# Patient Record
Sex: Male | Born: 1980 | Race: White | Hispanic: No | Marital: Single | Smoking: Never smoker
Health system: Southern US, Community
[De-identification: ages and names within clinical notes are randomized; demographics above are authoritative.]

## PROBLEM LIST (undated history)

## (undated) DIAGNOSIS — Z9289 Personal history of other medical treatment: Secondary | ICD-10-CM

## (undated) DIAGNOSIS — J45990 Exercise induced bronchospasm: Secondary | ICD-10-CM

## (undated) DIAGNOSIS — N200 Calculus of kidney: Secondary | ICD-10-CM

## (undated) DIAGNOSIS — F419 Anxiety disorder, unspecified: Secondary | ICD-10-CM

## (undated) DIAGNOSIS — R030 Elevated blood-pressure reading, without diagnosis of hypertension: Secondary | ICD-10-CM

## (undated) DIAGNOSIS — E162 Hypoglycemia, unspecified: Secondary | ICD-10-CM

## (undated) HISTORY — DX: Elevated blood-pressure reading, without diagnosis of hypertension: R03.0

## (undated) HISTORY — DX: Calculus of kidney: N20.0

## (undated) HISTORY — PX: HERNIA REPAIR: SHX51

## (undated) HISTORY — DX: Personal history of other medical treatment: Z92.89

---

## 2016-01-26 ENCOUNTER — Emergency Department (HOSPITAL_COMMUNITY)
Admission: EM | Admit: 2016-01-26 | Discharge: 2016-01-26 | Disposition: A | Discharge status: Home / Self Care | Payer: Self-pay | Attending: Emergency Medicine | Admitting: Emergency Medicine

## 2016-01-26 ENCOUNTER — Encounter (HOSPITAL_COMMUNITY): Payer: Self-pay

## 2016-01-26 DIAGNOSIS — Y9389 Activity, other specified: Secondary | ICD-10-CM | POA: Insufficient documentation

## 2016-01-26 DIAGNOSIS — Y990 Civilian activity done for income or pay: Secondary | ICD-10-CM | POA: Insufficient documentation

## 2016-01-26 DIAGNOSIS — T18120A Food in esophagus causing compression of trachea, initial encounter: Secondary | ICD-10-CM | POA: Insufficient documentation

## 2016-01-26 DIAGNOSIS — X58XXXA Exposure to other specified factors, initial encounter: Secondary | ICD-10-CM | POA: Insufficient documentation

## 2016-01-26 DIAGNOSIS — T18128A Food in esophagus causing other injury, initial encounter: Secondary | ICD-10-CM

## 2016-01-26 DIAGNOSIS — F17220 Nicotine dependence, chewing tobacco, uncomplicated: Secondary | ICD-10-CM | POA: Insufficient documentation

## 2016-01-26 DIAGNOSIS — Y929 Unspecified place or not applicable: Secondary | ICD-10-CM | POA: Insufficient documentation

## 2016-01-26 MED ORDER — OMEPRAZOLE 20 MG PO CPDR: 20.0000 mg | DELAYED_RELEASE_CAPSULE | Freq: Two times a day (BID) | ORAL | 1 refills | Status: DC

## 2016-01-26 MED ORDER — GLUCAGON HCL RDNA (DIAGNOSTIC) 1 MG IJ SOLR
1.0000 mg | Freq: Once | INTRAMUSCULAR | Status: AC
  Administered 2016-01-26: 1 mg via INTRAVENOUS
  Filled 2016-01-26: qty 1

## 2016-01-26 MED ORDER — ONDANSETRON HCL 4 MG/2ML IJ SOLN
4.0000 mg | Freq: Once | INTRAMUSCULAR | Status: AC
  Administered 2016-01-26: 4 mg via INTRAVENOUS
  Filled 2016-01-26: qty 2

## 2016-01-26 NOTE — ED Provider Notes (Addendum)
[facility]-EMERGENCY DEPT Provider Note   CSN: [id number] Arrival date & time: 01/26/16  2024     History   Chief Complaint Chief Complaint  Patient presents with  . Foreign Body    HPI Bruce Ryan is a 35 y.o. male. He presents with esophageal foreign body. Patient has history of a previous retained foreign body of some roast beef in February of this year. It seemed local emergency room. Was given glucagon. He states that about 3 hours later "about the same time that GI doctor got there" that passed. He did not have to undergo EGD or extraction. He did not see GI in follow-up. I.e. has never had endoscopy. He does use smokeless tobacco. He takes no medications. Reports no other significant medical history.  He was eating some steak tonight at work. He admits that he was hurting. He felt it lodge the base of his neck. He puts a finger towards the sternal notch. He is drooling and spitting saliva. He has been symptomatic for 2 hours upon his arrival.  He became symptomatic at approximately 6:45 PM tonight.  HPI  History reviewed. No pertinent past medical history.  There are no active problems to display for this patient.   History reviewed. No pertinent surgical history.     Home Medications    Prior to Admission medications   Medication Sig Start Date End Date Taking? Authorizing Provider  pseudoephedrine (SUDAFED) 60 MG tablet Take 60 mg by mouth every 4 (four) hours as needed for congestion.   Yes Historical Provider, MD  omeprazole (PRILOSEC) 20 MG capsule Take 1 capsule (20 mg total) by mouth 2 (two) times daily. 01/26/16   Rolland PorterMark Annia Gomm, MD    Family History History reviewed. No pertinent family history.  Social History Social History  Substance Use Topics  . Smoking status: Never Smoker  . Smokeless tobacco: Current User    Types: Chew  . Alcohol use Yes     Allergies   Coconut flavor and Penicillins   Review of Systems Review of Systems  Constitutional:  Negative for appetite change, chills, diaphoresis, fatigue and fever.  HENT: Negative for mouth sores, sore throat and trouble swallowing.        Throat pain. Unable to handle secretions. Spitting  Eyes: Negative for visual disturbance.  Respiratory: Negative for cough, chest tightness, shortness of breath and wheezing.   Cardiovascular: Negative for chest pain.  Gastrointestinal: Negative for abdominal distention, abdominal pain, diarrhea, nausea and vomiting.  Endocrine: Negative for polydipsia, polyphagia and polyuria.  Genitourinary: Negative for dysuria, frequency and hematuria.  Musculoskeletal: Negative for gait problem.  Skin: Negative for color change, pallor and rash.  Neurological: Negative for dizziness, syncope, light-headedness and headaches.  Hematological: Does not bruise/bleed easily.  Psychiatric/Behavioral: Negative for behavioral problems and confusion.     Physical Exam Updated Vital Signs BP (!) 187/104 (BP Location: Left Arm)   Pulse 106   Temp 99 F (37.2 C) (Oral)   Resp 18   Wt 267 lb 8 oz (121.3 kg)   SpO2 99%   Physical Exam  Constitutional: He is oriented to person, place, and time. He appears well-developed and well-nourished. No distress.  HENT:  Head: Normocephalic.  Holding emesis bag. Drooling and spitting. No respiratory distress.  Eyes: Conjunctivae are normal. Pupils are equal, round, and reactive to light. No scleral icterus.  Neck: Normal range of motion. Neck supple. No thyromegaly present.  Cardiovascular: Normal rate and regular rhythm.  Exam reveals no gallop and  no friction rub.   No murmur heard. Pulmonary/Chest: Effort normal and breath sounds normal. No respiratory distress. He has no wheezes. He has no rales.  Abdominal: Soft. Bowel sounds are normal. He exhibits no distension. There is no tenderness. There is no rebound.  Musculoskeletal: Normal range of motion.  Neurological: He is alert and oriented to person, place, and time.    Skin: Skin is warm and dry. No rash noted.  Psychiatric: He has a normal mood and affect. His behavior is normal.     ED Treatments / Results  Labs (all labs ordered are listed, but only abnormal results are displayed) Labs Reviewed - No data to display  EKG  EKG Interpretation None       Radiology No results found.  Procedures Procedures (including critical care time)  Medications Ordered in ED Medications  ondansetron (ZOFRAN) injection 4 mg (4 mg Intravenous Given 01/26/16 2139)  glucagon (human recombinant) (GLUCAGEN) injection 1 mg (1 mg Intravenous Given 01/26/16 2140)     Initial Impression / Assessment and Plan / ED Course  I have reviewed the triage vital signs and the nursing notes.  Pertinent labs & imaging results that were available during my care of the patient were reviewed by me and considered in my medical decision making (see chart for details).  Clinical Course    IV is placed. Given glucagon and Zofran. I placed a call to GI initially due to the lateness of the hour. At approximately 1 hour's time patient had resolution of symptoms. On reevaluation he has no other drooling. He is able to take sips of water followed by drinking entire glass of water without becoming symptomatic. He is appropriate for discharge home. I discussed with him the importance of small portions chewing his food well. Of also stressed the absolute importance of GI follow-up to evaluate for possible anatomical abnormality such as esophageal ring, stricture, remote possibility of cancer is dizzy smokeless tobacco. Encouraged to stop. He expressed understanding of these. We will discharge on proton pump inhibitor.  Final Clinical Impressions(s) / ED Diagnoses   Final diagnoses:  Food impaction of esophagus, initial encounter    New Prescriptions New Prescriptions   OMEPRAZOLE (PRILOSEC) 20 MG CAPSULE    Take 1 capsule (20 mg total) by mouth 2 (two) times daily.     Rolland PorterMark Carleigh Buccieri,  MD 01/26/16 [id number]    Rolland PorterMark Ayriel Texidor, MD 01/26/16 2223

## 2016-01-26 NOTE — Discharge Instructions (Signed)
Chew your food well. Small portions only.  Call our gastroenterology at the above number for follow-up appointment to discuss endoscopy.   You may have an anatomical abnormality of your esophagus such as a stricture that may need treatment to prevent further episodes.  Prilosec prescription as prescribed.

## 2016-01-26 NOTE — Progress Notes (Signed)
EDCM spoke to patient at bedside. Patient confirms he does not have a pcp or insurance living in [place].  EDCM provided patient with contact information to [facility], informed patient of services there.  EDCM also provided patient with list of pcps who accept self pay patients, list of discount pharmacies and websites needymeds.org and GoodRX.com for medication assistance, phone number to inquire about the orange card, phone number to inquire about Medicaid, phone number to inquire about the Affordable Care Act, financial resources in the community such as local churches, [employer], urban ministries, and dental assistance for uninsured patients.  Patient thankful for resources.  No further EDCM needs at this time. 

## 2016-01-26 NOTE — ED Triage Notes (Signed)
Pt has a piece of steak in his throat, he's able to swallow right now and is talking, pt has had this happen before.  Pt says this happened at 645pm

## 2016-01-26 NOTE — ED Notes (Signed)
Pt stated "I'm cured".

## 2016-02-24 ENCOUNTER — Encounter (HOSPITAL_COMMUNITY): Payer: Self-pay | Admitting: Emergency Medicine

## 2016-02-24 ENCOUNTER — Emergency Department (HOSPITAL_COMMUNITY)
Admission: EM | Admit: 2016-02-24 | Discharge: 2016-02-24 | Disposition: A | Discharge status: Home / Self Care | Payer: Self-pay | Attending: Emergency Medicine | Admitting: Emergency Medicine

## 2016-02-24 ENCOUNTER — Emergency Department (HOSPITAL_COMMUNITY): Payer: Self-pay

## 2016-02-24 DIAGNOSIS — F410 Panic disorder [episodic paroxysmal anxiety] without agoraphobia: Secondary | ICD-10-CM | POA: Insufficient documentation

## 2016-02-24 DIAGNOSIS — F1010 Alcohol abuse, uncomplicated: Secondary | ICD-10-CM | POA: Insufficient documentation

## 2016-02-24 LAB — I-STAT TROPONIN, ED: Troponin i, poc: 0 ng/mL (ref 0.00–0.08)

## 2016-02-24 LAB — CBC
HEMATOCRIT: 50.4 % (ref 39.0–52.0)
Hemoglobin: 17.3 g/dL — ABNORMAL HIGH (ref 13.0–17.0)
MCH: 31.1 pg (ref 26.0–34.0)
MCHC: 34.3 g/dL (ref 30.0–36.0)
MCV: 90.6 fL (ref 78.0–100.0)
PLATELETS: 286 10*3/uL (ref 150–400)
RBC: 5.56 MIL/uL (ref 4.22–5.81)
RDW: 13 % (ref 11.5–15.5)
WBC: 12.6 10*3/uL — AB (ref 4.0–10.5)

## 2016-02-24 LAB — BASIC METABOLIC PANEL
Anion gap: 6 (ref 5–15)
BUN: 10 mg/dL (ref 6–20)
CHLORIDE: 105 mmol/L (ref 101–111)
CO2: 27 mmol/L (ref 22–32)
Calcium: 9.4 mg/dL (ref 8.9–10.3)
Creatinine, Ser: 0.92 mg/dL (ref 0.61–1.24)
GFR calc non Af Amer: >60 mL/min (ref >60)
Glucose, Bld: 99 mg/dL (ref 65–99)
POTASSIUM: 4.3 mmol/L (ref 3.5–5.1)
SODIUM: 138 mmol/L (ref 135–145)

## 2016-02-24 MED ORDER — LORAZEPAM 1 MG PO TABS
1.0000 mg | ORAL_TABLET | Freq: Three times a day (TID) | ORAL | 0 refills | Status: DC | PRN
Start: 2016-02-24 — End: 2016-07-25

## 2016-02-24 MED ORDER — LORAZEPAM 1 MG PO TABS
1.0000 mg | ORAL_TABLET | Freq: Once | ORAL | Status: AC
  Administered 2016-02-24: 1 mg via ORAL
  Filled 2016-02-24: qty 1

## 2016-02-24 MED ORDER — LORAZEPAM 1 MG PO TABS: 1.0000 mg | ORAL_TABLET | Freq: Three times a day (TID) | ORAL | 0 refills | Status: DC | PRN

## 2016-02-24 NOTE — Discharge Instructions (Signed)
Read the information below.  You may return to the Emergency Department at any time for worsening condition or any new symptoms that concern you.   If you develop worsening chest pain, shortness of breath, fever, you pass out, or become weak or dizzy, return to the ER for a recheck.    °

## 2016-02-24 NOTE — ED Triage Notes (Signed)
Pt c/o intermittent chest tightness, palpitations, sensation of tachycardia, tingling across body, diaphoresis, anxiety onset Saturday. Worse when thinking about symptoms. Better when walking. Pt started drinking heavily, one 12 pack of beer every day, every day in July after losing job, stopped drinking on Saturday. Still unemployed which causes emotional distress. Had alcohol on Sunday which made symptoms worse.

## 2016-02-24 NOTE — ED Notes (Signed)
Provider in room  

## 2016-02-24 NOTE — ED Provider Notes (Signed)
[facility]-EMERGENCY DEPT Provider Note   CSN: [id number] Arrival date & time: 02/24/16  1636     History   Chief Complaint Chief Complaint  Patient presents with  . Chest Pain    HPI Bruce Ryan is a 35 y.o. male.  HPI  Patient presents with intermittent episodes of heart racing, chest tightness, SOB, tingling in the extremities, sweating that last approximately 15-20 minutes.  They began 5 days ago and occur randomly.  They do not occur if patient is busy or distracted.  The symptoms improve with talking to loved ones or walking around.  Pt has been drinking approximately 12 cans of beer daily since the summer.  This weekend he stopped drinking, but started having symptoms again after drinking 3-4 beers.  He has not had any ETOH in 4 days.   Denies recent immobilization, leg swelling, personal or family hx blood clots.  Denies medical problems with exception of asthma.  Denies early family hx CAD - his father died of MI in his late 3560s, was his first known MI.     History reviewed. No pertinent past medical history.  There are no active problems to display for this patient.   History reviewed. No pertinent surgical history.     Home Medications    Prior to Admission medications   Medication Sig Start Date End Date Taking? Authorizing Provider  ibuprofen (ADVIL,MOTRIN) 200 MG tablet Take 200-400 mg by mouth every 6 (six) hours as needed for fever, headache, mild pain, moderate pain or cramping.   Yes Historical Provider, MD  LORazepam (ATIVAN) 1 MG tablet Take 1 tablet (1 mg total) by mouth 3 (three) times daily as needed for anxiety (or withdrawal symptoms). 02/24/16   Trixie DredgeEmily Harvard Zeiss, PA-C  omeprazole (PRILOSEC) 20 MG capsule Take 1 capsule (20 mg total) by mouth 2 (two) times daily. Patient not taking: Reported on 02/24/2016 01/26/16   Rolland PorterMark James, MD    Family History History reviewed. No pertinent family history.  Social History Social History  Substance Use Topics  .  Smoking status: Never Smoker  . Smokeless tobacco: Current User    Types: Chew  . Alcohol use Yes     Allergies   Coconut flavor and Penicillins   Review of Systems Review of Systems  All other systems reviewed and are negative.    Physical Exam Updated Vital Signs BP 141/92 (BP Location: Right Arm)   Pulse 65   Temp 97.9 F (36.6 C) (Oral)   Resp 18   Ht 5\' 11"  (1.803 m)   Wt 117.9 kg   SpO2 98%   BMI 36.26 kg/m   Physical Exam  Constitutional: He appears well-developed and well-nourished. No distress.  HENT:  Head: Normocephalic and atraumatic.  Neck: Neck supple.  Cardiovascular: Normal rate and regular rhythm.   Pulmonary/Chest: Effort normal and breath sounds normal. No respiratory distress. He has no wheezes. He has no rales.  Abdominal: Soft. He exhibits no distension and no mass. There is no tenderness. There is no rebound and no guarding.  Musculoskeletal: He exhibits no edema.  Neurological: He is alert. He exhibits normal muscle tone.  Skin: He is not diaphoretic.  Psychiatric: His speech is normal and behavior is normal. Thought content normal. His mood appears anxious.  Nursing note and vitals reviewed.    ED Treatments / Results  Labs (all labs ordered are listed, but only abnormal results are displayed) Labs Reviewed  CBC - Abnormal; Notable for the following:  Result Value   WBC 12.6 (*)    Hemoglobin 17.3 (*)    All other components within normal limits  BASIC METABOLIC PANEL  I-STAT TROPOININ, ED    EKG  EKG Interpretation None       Radiology Dg Chest 2 View  Result Date: 02/24/2016 CLINICAL DATA:  Intermittent chest tightness, palpitations and tachycardia. EXAM: CHEST  2 VIEW COMPARISON:  None. FINDINGS: The heart size and mediastinal contours are within normal limits. Both lungs are clear. The visualized skeletal structures are unremarkable. IMPRESSION: No active cardiopulmonary disease. Electronically Signed   By: Tollie Ethavid   Kwon M.D.   On: 02/24/2016 19:06    Procedures Procedures (including critical care time)  Medications Ordered in ED Medications  LORazepam (ATIVAN) tablet 1 mg (1 mg Oral Given 02/24/16 2022)     Initial Impression / Assessment and Plan / ED Course  I have reviewed the triage vital signs and the nursing notes.  Pertinent labs & imaging results that were available during my care of the patient were reviewed by me and considered in my medical decision making (see chart for details).  Clinical Course    Afebrile, nontoxic patient with constellation of symptoms suspicious for panic attacks vs possible alcohol withdrawal.  Chest pain is atypical - comes and goes, and does not happen with distraction, better with talking to people and calming down.  Doubt ACS, PE, pneumonia, dissection.  He is hypertensive in ED but also appears very anxious, improved after ativan.  D/C home with PCP and substance abuse resources for close follow up, ativan for symptom management.  Discussed result, findings, treatment, and follow up  with patient.  Pt given return precautions.  Pt verbalizes understanding and agrees with plan.       Final Clinical Impressions(s) / ED Diagnoses   Final diagnoses:  Panic attack  Alcohol abuse    New Prescriptions Current Discharge Medication List    START taking these medications   Details  LORazepam (ATIVAN) 1 MG tablet Take 1 tablet (1 mg total) by mouth 3 (three) times daily as needed for anxiety (or withdrawal symptoms). Qty: 15 tablet, Refills: 0         Trixie Dredgemily Billey Wojciak, PA-C 02/24/16 2141    Arby BarretteMarcy Pfeiffer, MD 02/25/16 [phone]

## 2016-03-17 ENCOUNTER — Encounter (HOSPITAL_COMMUNITY): Payer: Self-pay

## 2016-03-17 ENCOUNTER — Emergency Department (HOSPITAL_COMMUNITY)
Admission: EM | Admit: 2016-03-17 | Discharge: 2016-03-17 | Disposition: A | Discharge status: Home / Self Care | Payer: Self-pay | Attending: Emergency Medicine | Admitting: Emergency Medicine

## 2016-03-17 ENCOUNTER — Emergency Department (HOSPITAL_COMMUNITY): Payer: Self-pay

## 2016-03-17 DIAGNOSIS — J45909 Unspecified asthma, uncomplicated: Secondary | ICD-10-CM | POA: Insufficient documentation

## 2016-03-17 DIAGNOSIS — Z79899 Other long term (current) drug therapy: Secondary | ICD-10-CM | POA: Insufficient documentation

## 2016-03-17 DIAGNOSIS — R079 Chest pain, unspecified: Secondary | ICD-10-CM

## 2016-03-17 DIAGNOSIS — R0789 Other chest pain: Secondary | ICD-10-CM | POA: Insufficient documentation

## 2016-03-17 HISTORY — DX: Anxiety disorder, unspecified: F41.9

## 2016-03-17 HISTORY — DX: Hypoglycemia, unspecified: E16.2

## 2016-03-17 HISTORY — DX: Exercise induced bronchospasm: J45.990

## 2016-03-17 LAB — CBC
HCT: 48 % (ref 39.0–52.0)
HEMOGLOBIN: 16.3 g/dL (ref 13.0–17.0)
MCH: 30.8 pg (ref 26.0–34.0)
MCHC: 34 g/dL (ref 30.0–36.0)
MCV: 90.7 fL (ref 78.0–100.0)
PLATELETS: 297 10*3/uL (ref 150–400)
RBC: 5.29 MIL/uL (ref 4.22–5.81)
RDW: 12.8 % (ref 11.5–15.5)
WBC: 11.3 10*3/uL — ABNORMAL HIGH (ref 4.0–10.5)

## 2016-03-17 LAB — BASIC METABOLIC PANEL
ANION GAP: 9 (ref 5–15)
BUN: 9 mg/dL (ref 6–20)
CALCIUM: 9.8 mg/dL (ref 8.9–10.3)
CO2: 23 mmol/L (ref 22–32)
CREATININE: 0.94 mg/dL (ref 0.61–1.24)
Chloride: 107 mmol/L (ref 101–111)
GFR calc Af Amer: >60 mL/min (ref >60)
GLUCOSE: 98 mg/dL (ref 65–99)
Potassium: 3.7 mmol/L (ref 3.5–5.1)
Sodium: 139 mmol/L (ref 135–145)

## 2016-03-17 LAB — I-STAT TROPONIN, ED
TROPONIN I, POC: 0.01 ng/mL (ref 0.00–0.08)
Troponin i, poc: 0 ng/mL (ref 0.00–0.08)

## 2016-03-17 NOTE — ED Triage Notes (Signed)
Per Pt, Pt is coming from home with complaints of SOB and Chest pain that started today. Pt was walking when the episode came along. Pt has Hx of anxiety and recent stress. For about five or six years, pt self medicated with alcohol. Pt has been sober for one month. Pt's anxiety has increased significantly. [facility] same symptoms a couple weeks ago and was diagnosed with withdrawal and anxiety. Pt's chest pain subsided while EMS and was going to come himslef, pt left to drive here and pain returned. EMS brought patient in due to return of CP. Pt arrives to the ED with no Chest pain. Pt took 324 mg of ASA at the house before EMS arrival. 138/92, 89 CBG, 97% on RA, 66 HR.

## 2016-03-17 NOTE — ED Notes (Signed)
ED Provider at bedside. 

## 2016-03-17 NOTE — ED Provider Notes (Signed)
[facility]-EMERGENCY DEPT Provider Note   CSN: [id number] Arrival date & time: 03/17/16  1740     History   Chief Complaint Chief Complaint  Patient presents with  . Chest Pain    HPI Bruce Ryan is a [age] y.o. male.  Patient without other medical problems presents for evaluation of chest pain. He describes the discomfort as pressure, intermittent, associated with SOB. Currently pain free. He reports that over the last several months he has these episodes, usually brought on by activity or eating, and lately have included "heart fluttering" and high than usual heart rate. He was seen one month ago and was told his symptoms were related to alcohol use and anxiety. Tonight he states that his symptoms included radiation into the shoulder and arm. He states that in the last month he has stopped drinking, stopped use of caffeine, stopped using e-cigarettes and changed his diet to more healthy foods and less salt, but reports his symptoms have been the same.    The history is provided by the patient. No language interpreter was used.  Chest Pain   Associated symptoms include diaphoresis, palpitations and shortness of breath. Pertinent negatives include no abdominal pain, no cough, no fever, no nausea and no weakness.    Past Medical History:  Diagnosis Date  . Anxiety   . Exercise-induced asthma   . Hypoglycemia     There are no active problems to display for this patient.   Past Surgical History:  Procedure Laterality Date  . HERNIA REPAIR         Home Medications    Prior to Admission medications   Medication Sig Start Date End Date Taking? Authorizing Provider  ibuprofen (ADVIL,MOTRIN) 200 MG tablet Take 200-400 mg by mouth every 6 (six) hours as needed for fever, headache, mild pain, moderate pain or cramping.    Historical Provider, MD  LORazepam (ATIVAN) 1 MG tablet Take 1 tablet (1 mg total) by mouth 3 (three) times daily as needed for anxiety (or withdrawal symptoms).  02/24/16   Trixie DredgeEmily West, PA-C  omeprazole (PRILOSEC) 20 MG capsule Take 1 capsule (20 mg total) by mouth 2 (two) times daily. Patient not taking: Reported on 02/24/2016 01/26/16   Rolland PorterMark James, MD    Family History No family history on file.  Social History Social History  Substance Use Topics  . Smoking status: Never Smoker  . Smokeless tobacco: Current User    Types: Chew  . Alcohol use No     Comment: Sober for one month      Allergies   Coconut flavor and Penicillins   Review of Systems Review of Systems  Constitutional: Positive for diaphoresis. Negative for chills and fever.  Respiratory: Positive for shortness of breath. Negative for cough.   Cardiovascular: Positive for chest pain and palpitations.  Gastrointestinal: Negative.  Negative for abdominal pain and nausea.  Musculoskeletal: Negative.   Neurological: Negative.  Negative for syncope and weakness.     Physical Exam Updated Vital Signs BP 131/94 (BP Location: Right Arm)   Pulse 66   Temp 98.1 F (36.7 C) (Oral)   Resp 14   Ht 6' (1.829 m)   Wt 117.9 kg   SpO2 97%   BMI 35.26 kg/m   Physical Exam  Constitutional: He is oriented to person, place, and time. He appears well-developed and well-nourished.  HENT:  Head: Normocephalic.  Neck: Normal range of motion. Neck supple.  Cardiovascular: Normal rate and regular rhythm.   No murmur heard.  No carotid bruit.  Pulmonary/Chest: Effort normal and breath sounds normal. He has no wheezes. He has no rales.  Abdominal: Soft. Bowel sounds are normal. There is no tenderness. There is no rebound and no guarding.  Musculoskeletal: Normal range of motion. He exhibits no edema.  Neurological: He is alert and oriented to person, place, and time.  Skin: Skin is warm and dry. No rash noted.  Psychiatric: He has a normal mood and affect.     ED Treatments / Results  Labs (all labs ordered are listed, but only abnormal results are displayed) Labs Reviewed  CBC  - Abnormal; Notable for the following:       Result Value   WBC 11.3 (*)    All other components within normal limits  BASIC METABOLIC PANEL  I-STAT TROPOININ, ED  Rosezena SensorI-STAT TROPOININ, ED    EKG  EKG Interpretation None       Radiology Dg Chest 2 View  Result Date: 03/17/2016 CLINICAL DATA:  Shortness of breath, chest pain EXAM: CHEST  2 VIEW COMPARISON:  02/24/2016 FINDINGS: Lungs are clear.  No pleural effusion or pneumothorax. The heart is normal in size. Visualized osseous structures are within normal limits. IMPRESSION: Normal chest radiographs. Electronically Signed   By: Charline BillsSriyesh  Krishnan M.D.   On: 03/17/2016 18:56    Procedures Procedures (including critical care time)  Medications Ordered in ED Medications - No data to display   Initial Impression / Assessment and Plan / ED Course  I have reviewed the triage vital signs and the nursing notes.  Pertinent labs & imaging results that were available during my care of the patient were reviewed by me and considered in my medical decision making (see chart for details).  Clinical Course     Patient with history of chest pain and SOB presents with same. Currently symptom free. Heart score of 1.  Evaluation today, including EKG, CXR, troponin and delta troponin are negative. He reports these symptoms as chronic. Suspect component of anxiety.  He can be discharged home and is encouraged to establish with a primary care provider for further outpatient evaluation. Referrals provided. Encouraged him to continue alcohol abstinance, his improved diet and not smoking.   Final Clinical Impressions(s) / ED Diagnoses   Final diagnoses:  None  1. Chest pain   New Prescriptions New Prescriptions   No medications on file     Danne HarborShari Kee Drudge, PA-C 03/17/16 2307    Lavera Guiseana Duo Liu, MD 03/18/16 1055

## 2016-03-17 NOTE — Discharge Instructions (Signed)
Your lab, x-ray, EKG studies here are all negative. You can be discharged home and are encouraged to get established with a primary care provider for further outpatient evaluation if symptoms continues. Return to the Emergency Department with any new or concerning symptoms at any time.

## 2016-07-14 ENCOUNTER — Emergency Department (HOSPITAL_COMMUNITY)
Admission: EM | Admit: 2016-07-14 | Discharge: 2016-07-14 | Disposition: A | Discharge status: Home / Self Care | Payer: BLUE CROSS/BLUE SHIELD | Attending: Emergency Medicine | Admitting: Emergency Medicine

## 2016-07-14 ENCOUNTER — Emergency Department (HOSPITAL_COMMUNITY): Payer: BLUE CROSS/BLUE SHIELD

## 2016-07-14 ENCOUNTER — Encounter (HOSPITAL_COMMUNITY): Payer: Self-pay | Admitting: *Deleted

## 2016-07-14 DIAGNOSIS — J45909 Unspecified asthma, uncomplicated: Secondary | ICD-10-CM | POA: Insufficient documentation

## 2016-07-14 DIAGNOSIS — R002 Palpitations: Secondary | ICD-10-CM | POA: Diagnosis not present

## 2016-07-14 DIAGNOSIS — Z7982 Long term (current) use of aspirin: Secondary | ICD-10-CM | POA: Diagnosis not present

## 2016-07-14 DIAGNOSIS — R079 Chest pain, unspecified: Secondary | ICD-10-CM

## 2016-07-14 LAB — CBC
HCT: 48 % (ref 39.0–52.0)
Hemoglobin: 16.3 g/dL (ref 13.0–17.0)
MCH: 30 pg (ref 26.0–34.0)
MCHC: 34 g/dL (ref 30.0–36.0)
MCV: 88.2 fL (ref 78.0–100.0)
Platelets: 264 10*3/uL (ref 150–400)
RBC: 5.44 MIL/uL (ref 4.22–5.81)
RDW: 13.7 % (ref 11.5–15.5)
WBC: 13.1 10*3/uL — AB (ref 4.0–10.5)

## 2016-07-14 LAB — BASIC METABOLIC PANEL
ANION GAP: 9 (ref 5–15)
BUN: 9 mg/dL (ref 6–20)
CALCIUM: 9.5 mg/dL (ref 8.9–10.3)
CO2: 25 mmol/L (ref 22–32)
Chloride: 103 mmol/L (ref 101–111)
Creatinine, Ser: 0.76 mg/dL (ref 0.61–1.24)
GFR calc Af Amer: >60 mL/min (ref >60)
GLUCOSE: 102 mg/dL — AB (ref 65–99)
Potassium: 3.7 mmol/L (ref 3.5–5.1)
SODIUM: 137 mmol/L (ref 135–145)

## 2016-07-14 LAB — I-STAT TROPONIN, ED
TROPONIN I, POC: 0 ng/mL (ref 0.00–0.08)
Troponin i, poc: 0 ng/mL (ref 0.00–0.08)

## 2016-07-14 NOTE — Discharge Instructions (Signed)
Avoid caffeine and tobacco.   SChedule appointment with cardiology next week.  If you were given medicines take as directed.  If you are on coumadin or contraceptives realize their levels and effectiveness is altered by many different medicines.  If you have any reaction (rash, tongues swelling, other) to the medicines stop taking and see a physician.    If your blood pressure was elevated in the ER make sure you follow up for management with a primary doctor or return for chest pain, shortness of breath or stroke symptoms.  Please follow up as directed and return to the ER or see a physician for new or worsening symptoms.  Thank you. Vitals:   07/14/16 1332 07/14/16 1609  BP: (!) 169/107 133/89  Pulse: 75 67  Resp: 19 18  Temp: 97.9 F (36.6 C) 98 F (36.7 C)  TempSrc: Oral Oral  SpO2: 100% 98%

## 2016-07-14 NOTE — ED Provider Notes (Signed)
[facility]EMER[facility]ENCY DEPT Provider Note   CSN: [id number] Arrival date & time: 07/14/16  1321     History   Chief Complaint Chief Complaint  Patient presents with  . Chest Pain    HPI Bruce Ryan is a 36 y.o. male.  Patient presents with intermittent chest pain and palpitations. Patient had an episode yesterday last almost her today and resolved and then had another episode this morning that lasted 1-2 hours. Patient felt that his heart rate was around 1:30 throughout the day yesterday and then today was around 50. Patient's family history of both coronary artery disease and arrhythmia in his mother. Patient has improved his diet significantly and lost 35 pounds. Recent thyroid test was normal per his report. Patient denies any blood clot risk factors, no known cardiac issues. No exertional syncope. Patient has gotten lightheaded recently at the palpitations.      Past Medical History:  Diagnosis Date  . Anxiety   . Exercise-induced asthma   . Hypoglycemia     There are no active problems to display for this patient.   Past Surgical History:  Procedure Laterality Date  . HERNIA REPAIR         Home Medications    Prior to Admission medications   Medication Sig Start Date End Date Taking? Authorizing Provider  albuterol (PROVENTIL HFA;VENTOLIN HFA) 108 (90 Base) MCG/ACT inhaler Inhale 1 puff into the lungs every 6 (six) hours as needed for wheezing or shortness of breath.    Historical Provider, MD  aspirin 81 MG chewable tablet Chew 81 mg by mouth daily as needed for mild pain.    Historical Provider, MD  ibuprofen (ADVIL,MOTRIN) 200 MG tablet Take 200-400 mg by mouth every 6 (six) hours as needed for fever, headache, mild pain, moderate pain or cramping.    Historical Provider, MD  LORazepam (ATIVAN) 1 MG tablet Take 1 tablet (1 mg total) by mouth 3 (three) times daily as needed for anxiety (or withdrawal symptoms). 02/24/16   Trixie Dredge, PA-C  omeprazole (PRILOSEC) 20  MG capsule Take 1 capsule (20 mg total) by mouth 2 (two) times daily. Patient not taking: Reported on 03/17/2016 01/26/16   Rolland Porter, MD    Family History No family history on file.  Social History Social History  Substance Use Topics  . Smoking status: Never Smoker  . Smokeless tobacco: Current User    Types: Chew  . Alcohol use No     Comment: Sober for one month      Allergies   Coconut flavor and Penicillins   Review of Systems Review of Systems  Constitutional: Negative for chills and fever.  HENT: Negative for congestion.   Eyes: Negative for visual disturbance.  Respiratory: Negative for shortness of breath.   Cardiovascular: Positive for chest pain and palpitations. Negative for leg swelling.  Gastrointestinal: Negative for abdominal pain and vomiting.  Genitourinary: Negative for dysuria and flank pain.  Musculoskeletal: Negative for back pain, neck pain and neck stiffness.  Skin: Negative for rash.  Neurological: Positive for light-headedness. Negative for headaches.     Physical Exam Updated Vital Signs BP 133/89 (BP Location: Left Arm)   Pulse 67   Temp 98 F (36.7 C) (Oral)   Resp 18   SpO2 98%   Physical Exam  Constitutional: He is oriented to person, place, and time. He appears well-developed and well-nourished.  HENT:  Head: Normocephalic and atraumatic.  Eyes: Conjunctivae are normal. Right eye exhibits no discharge. Left eye exhibits  no discharge.  Neck: Normal range of motion. Neck supple. No tracheal deviation present.  Cardiovascular: Normal rate, regular rhythm and intact distal pulses.   Pulmonary/Chest: Effort normal and breath sounds normal.  Abdominal: Soft. He exhibits no distension. There is no tenderness. There is no guarding.  Musculoskeletal: He exhibits no edema or tenderness.  Neurological: He is alert and oriented to person, place, and time.  Skin: Skin is warm. No rash noted.  Psychiatric: He has a normal mood and affect.    Nursing note and vitals reviewed.    ED Treatments / Results  Labs (all labs ordered are listed, but only abnormal results are displayed) Labs Reviewed  BASIC METABOLIC PANEL - Abnormal; Notable for the following:       Result Value   Glucose, Bld 102 (*)    All other components within normal limits  CBC - Abnormal; Notable for the following:    WBC 13.1 (*)    All other components within normal limits  I-STAT TROPOININ, ED  I-STAT TROPOININ, ED    EKG  EKG Interpretation  Date/Time:  Friday July 14 2016 13:29:48 EDT Ventricular Rate:  65 PR Interval:  158 QRS Duration: 68 QT Interval:  374 QTC Calculation: 388 R Axis:   28 Text Interpretation:  Normal sinus rhythm Low voltage QRS Borderline ECG Confirmed by Shanan Mcmiller MD, Merin Borjon [phone]) on 07/14/2016 3:43:48 PM       Radiology Dg Chest 2 View  Result Date: 07/14/2016 CLINICAL DATA:  Chest pain EXAM: CHEST  2 VIEW COMPARISON:  03/17/2016 FINDINGS: Normal heart size and mediastinal contours. No acute infiltrate or edema. No effusion or pneumothorax. No acute osseous findings. IMPRESSION: Negative chest. Electronically Signed   By: Marnee Spring M.D.   On: 07/14/2016 13:54    Procedures Procedures (including critical care time)  Medications Ordered in ED Medications - No data to display   Initial Impression / Assessment and Plan / ED Course  I have reviewed the triage vital signs and the nursing notes.  Pertinent labs & imaging results that were available during my care of the patient were reviewed by me and considered in my medical decision making (see chart for details).    Patient presents with intermittent chest pain and palpitations. Patient overall low risk cardiac. Low risk blood clot PE RC negative. Patient chest pain-free on exam. Discussed importance of follow-up for further evaluation from cardiology to discuss monitor and further workup. Patient comfortable this plan. Delta troponin negative. EKG  unremarkable.  Results and differential diagnosis were discussed with the patient/parent/guardian. Xrays were independently reviewed by myself.  Close follow up outpatient was discussed, comfortable with the plan.   Medications - No data to display  Vitals:   07/14/16 1332 07/14/16 1609  BP: (!) 169/107 133/89  Pulse: 75 67  Resp: 19 18  Temp: 97.9 F (36.6 C) 98 F (36.7 C)  TempSrc: Oral Oral  SpO2: 100% 98%    Final diagnoses:  Heart palpitations  Acute chest pain     Final Clinical Impressions(s) / ED Diagnoses   Final diagnoses:  Heart palpitations  Acute chest pain    New Prescriptions New Prescriptions   No medications on file     Blane Ohara, MD 07/14/16 1756

## 2016-07-14 NOTE — ED Triage Notes (Signed)
pt states that he began having chest pain about 1 hour prior. Pt states that he was laying down when it occured. Pt states that he noticed his heart was racing yesterday. Pt states that today it was reading low in the 50's

## 2016-07-21 ENCOUNTER — Encounter: Payer: Self-pay | Admitting: Physician Assistant

## 2016-07-25 ENCOUNTER — Encounter: Payer: Self-pay | Admitting: Physician Assistant

## 2016-07-25 ENCOUNTER — Ambulatory Visit (INDEPENDENT_AMBULATORY_CARE_PROVIDER_SITE_OTHER): Payer: BLUE CROSS/BLUE SHIELD | Admitting: Physician Assistant

## 2016-07-25 VITALS — BP 130/60 | HR 71 | Ht 71.0 in | Wt 235.1 lb

## 2016-07-25 DIAGNOSIS — R002 Palpitations: Secondary | ICD-10-CM | POA: Diagnosis not present

## 2016-07-25 DIAGNOSIS — R0789 Other chest pain: Secondary | ICD-10-CM | POA: Diagnosis not present

## 2016-07-25 NOTE — Patient Instructions (Addendum)
Medication Instructions:  Your physician recommends that you continue on your current medications as directed. Please refer to the Current Medication list given to you today..  Labwork: NONE ORDERED   Testing/Procedures: 1. Your physician has requested that you have a stress echocardiogram. For further information please visit [email or web address] Please follow instruction sheet as given.  2. Your physician has recommended that you wear an event monitor. Event monitors are medical devices that record the heart's electrical activity. Doctors most often [place] these monitors to diagnose arrhythmias. Arrhythmias are problems with the speed or rhythm of the heartbeat. The monitor is a small, portable device. You can wear one while you do your normal daily activities. This is usually used to diagnose what is causing palpitations/syncope (passing out).  Follow-Up: DR. Elease Hashimoto OR SCOTT WEAVER, PAC AS NEEDED PENDING TEST RESULTS  Any Other Special Instructions Will Be Listed Below (If Applicable). YOU HAVE BEEN GIVEN A LIST OF PRIMARY CARE PHYSICIANS TO CALL AND MAKE AN APPT  If you need a refill on your cardiac medications before your next appointment, please call your pharmacy.

## 2016-07-25 NOTE — Progress Notes (Signed)
Cardiology Office Note:    Date:  07/25/2016   ID:  Bruce Ryan, DOB 1980-04-02, MRN [id number]  PCP:  No PCP Per Patient  Cardiologist:  New - Dr. Delane Ginger / Tereso Newcomer, PA-C   Electrophysiologist:  n/a  Referring MD: No ref. provider found   Chief Complaint  Patient presents with  . Palpitations  . Chest Pain    History of Present Illness:    Bruce Ryan is a 36 y.o. male with a hx of exercised induced asthma, alcohol abuse, anxiety.    He was evaluated in the emergency room 07/14/16 for chest pain and palpitations. Troponin levels were negative. ECG demonstrated normal sinus rhythm. Chest x-ray was unremarkable.  He was asked to follow up with Cardiology for evaluation.   He is here alone. He previously chewed tobacco and drank heavily. He would drink 6-12 beers per day. He stopped drinking alcohol around November 2017. He notes palpitations since September 2017. He has started to diet and exercise. He's lost about 50 pounds. His palpitations are described as a hard heartbeat and skipping sensation. It may last seconds to minutes. He can notice it at rest or with exertion. He can also exert himself without symptoms. Since September, the frequency of his palpitations has decreased. He does get dizzy at times with palpitations. He denies frank syncope. He does note an associated headache and blurry vision at times. He walks daily for exercise. He may walk about an hour at a time. He sometimes has palpitations. Resting makes his symptoms better. He describes chest discomfort with his palpitations. He denies chest discomfort without palpitations. He denies dyspnea, orthopnea, PND or edema. He has been told he snores in the past. There has been no witnessed apnea. His girlfriend has told him that his snoring has improved dramatically since losing weight.  PAD Screen 07/25/2016  Previous PAD dx? No  Previous surgical procedure? No  Pain with walking? No  Feet/toe relief with dangling?  No  Painful, non-healing ulcers? No  Extremities discolored? No    Prior CV studies:   The following studies were reviewed today:  None   Past Medical History:  Diagnosis Date  . Anxiety   . Elevated blood pressure reading    hx of high BP - lifestyle modifications helped; no medication  . Exercise-induced asthma   . Hypoglycemia   . Nephrolithiasis     Past Surgical History:  Procedure Laterality Date  . HERNIA REPAIR Right     Current Medications: Current Meds  Medication Sig  . albuterol (PROVENTIL HFA;VENTOLIN HFA) 108 (90 Base) MCG/ACT inhaler Inhale 1 puff into the lungs every 6 (six) hours as needed for wheezing or shortness of breath.  Marland Kitchen aspirin 81 MG chewable tablet Chew 81 mg by mouth daily as needed for mild pain.  Marland Kitchen ibuprofen (ADVIL,MOTRIN) 200 MG tablet Take 200-400 mg by mouth every 6 (six) hours as needed for fever, headache, mild pain, moderate pain or cramping.     Allergies:   Coconut oil; Penicillins; and Coconut flavor   Social History   Social History  . Marital status: Single    Spouse name: N/A  . Number of children: 1  . Years of education: N/A   Occupational History  . Security [employer]   Social History Main Topics  . Smoking status: Never Smoker  . Smokeless tobacco: Former Neurosurgeon    Types: Chew  . Alcohol use No     Comment: previous 6-12 pack of  beer q day >> Sober since 01/2016  . Drug use: No  . Sexual activity: Not Asked   Other Topics Concern  . None   Social History Narrative   Originally from [place]   Works in [employer] at [employer]   Not married   1 child      Family History  Problem Relation Age of Onset  . Supraventricular tachycardia Mother   . Heart failure Mother   . Hypertension Mother   . Heart attack Father 60  . COPD Maternal Grandfather      ROS:   Please see the history of present illness.    Review of Systems  HENT: Positive for hearing loss.   Eyes: Positive for visual  disturbance.  Cardiovascular: Positive for chest pain and irregular heartbeat.  Respiratory: Positive for snoring.   Neurological: Positive for dizziness and headaches.   All other systems reviewed and are negative.   EKGs/Labs/Other Test Reviewed:    EKG:  EKG is ordered today.  The ekg ordered today demonstrates NSR, HR 71, normal axis, PR interval 168 ms, QTc 380 ms  Recent Labs: 07/14/2016: BUN 9; Creatinine, Ser 0.76; Hemoglobin 16.3; Platelets 264; Potassium 3.7; Sodium 137  TSH 03/30/16: 1.364  Recent Lipid Panel No results found for: CHOL, TRIG, HDL, CHOLHDL, VLDL, LDLCALC, LDLDIRECT 03/30/16: LDL 151, TC 212, TG 254, HDL 34  Physical Exam:    VS:  BP 130/60 (BP Location: Right Arm, Patient Position: Sitting, Cuff Size: Normal)   Pulse 71   Ht  (1.803 m)   Wt 235 lb 1.9 oz (106.6 kg)   SpO2 97%   BMI 32.79 kg/m     Wt Readings from Last 3 Encounters:  07/25/16 235 lb 1.9 oz (106.6 kg)  03/17/16 260 lb (117.9 kg)  02/24/16 260 lb (117.9 kg)     Physical Exam  Constitutional: He is oriented to person, place, and time. He appears well-developed and well-nourished. No distress.  HENT:  Head: Normocephalic and atraumatic.  Eyes: No scleral icterus.  Neck: Normal range of motion. No JVD present. Carotid bruit is not present.  Cardiovascular: Normal rate, regular rhythm, S1 normal and S2 normal.   No murmur heard. Pulmonary/Chest: Effort normal and breath sounds normal. He has no wheezes. He has no rhonchi. He has no rales.  Abdominal: Soft. There is no tenderness.  Musculoskeletal: He exhibits no edema.  Neurological: He is alert and oriented to person, place, and time.  Skin: Skin is warm and dry.  Psychiatric: He has a normal mood and affect.    ASSESSMENT:    1. Palpitations   2. Other chest pain    PLAN:    In order of problems listed above:  1. Palpitations -  Etiology not entirely clear. His ECG today is normal. TSH in January was normal. There is  no evidence of preexcitation. He denies a history of syncope. He does have exertional symptoms at times. However, he can exert himself at other times without symptoms. His symptoms have improved dramatically since he stopped drinking alcohol and losing weight.    -  Arrange stress echocardiogram  -  Arrange 30 day event monitor  2. Other chest pain -  His symptoms seem to be related to his palpitations. Since he has exertional symptoms at times, will proceed with stress echocardiogram to rule out ischemia and assess LV function, cardiac structure.  Proceed with 30 day event monitor as noted.  Dispo:  Return depending on  test results, for Follow up after testing, w/ Dr. Elease Hashimoto or Tereso Newcomer, PA-C.   Medication Adjustments/Labs and Tests Ordered: Current medicines are reviewed at length with the patient today.  Concerns regarding medicines are outlined above.  Orders placed this visit:  Orders Placed This Encounter  Procedures  . Cardiac event monitor  . EKG 12-Lead  . ECHOCARDIOGRAM STRESS TEST   Medication changes this visit: No orders of the defined types were placed in this encounter.   Signed, Tereso Newcomer, PA-C  07/25/2016 10:32 AM    [facility] [address] [place], [place], [place]  [address] Phone: [phone]; Fax: [phone]

## 2016-08-02 ENCOUNTER — Ambulatory Visit (INDEPENDENT_AMBULATORY_CARE_PROVIDER_SITE_OTHER): Payer: BLUE CROSS/BLUE SHIELD

## 2016-08-02 DIAGNOSIS — R002 Palpitations: Secondary | ICD-10-CM | POA: Diagnosis not present

## 2016-08-03 ENCOUNTER — Telehealth (HOSPITAL_COMMUNITY): Payer: Self-pay | Admitting: *Deleted

## 2016-08-03 NOTE — Telephone Encounter (Signed)
Left message on voicemail per DPR in reference to upcoming appointment scheduled on 08/09/16 at 7:30 with detailed instructions given per Stress Test Requisition Sheet for the test. LM to arrive 30 minutes early, and that it is imperative to arrive on time for appointment to keep from having the test rescheduled. If you need to cancel or reschedule your appointment, please call the office within 24 hours of your appointment. Failure to do so may result in a cancellation of your appointment, and a $50 no show fee. Phone number given for call back for any questions. Daneil DolinSharon S Brooks

## 2016-08-09 ENCOUNTER — Ambulatory Visit (HOSPITAL_COMMUNITY): Payer: BLUE CROSS/BLUE SHIELD

## 2016-08-09 ENCOUNTER — Telehealth: Payer: Self-pay | Admitting: *Deleted

## 2016-08-09 ENCOUNTER — Ambulatory Visit (HOSPITAL_COMMUNITY): Payer: BLUE CROSS/BLUE SHIELD | Attending: Cardiovascular Disease

## 2016-08-09 ENCOUNTER — Encounter: Payer: Self-pay | Admitting: Physician Assistant

## 2016-08-09 DIAGNOSIS — R0789 Other chest pain: Secondary | ICD-10-CM | POA: Insufficient documentation

## 2016-08-09 DIAGNOSIS — R002 Palpitations: Secondary | ICD-10-CM | POA: Insufficient documentation

## 2016-08-09 MED ORDER — PERFLUTREN LIPID MICROSPHERE
1.0000 mL | INTRAVENOUS | Status: AC | PRN
  Administered 2016-08-09: 3 mL via INTRAVENOUS
  Administered 2016-08-09: 2 mL via INTRAVENOUS
  Administered 2016-08-09: 1 mL via INTRAVENOUS

## 2016-08-09 NOTE — Telephone Encounter (Signed)
Lmtcb to go over Stress Echo results 

## 2016-08-09 NOTE — Telephone Encounter (Signed)
-----   Message from Beatrice LecherScott T Weaver, PA-C sent at 08/09/2016 12:12 PM EDT ----- Please call the patient. The stress test is normal. Continue current management.  Please fax a copy of this study result to his PCP:  Patient, No Pcp Per  Thanks! Tereso NewcomerScott Weaver, PA-C    08/09/2016 12:11 PM

## 2016-08-11 NOTE — Telephone Encounter (Signed)
Follow up     Pt is returning your call for echo results

## 2016-08-11 NOTE — Telephone Encounter (Signed)
Pt has been notified of Stress Echo results by phone with verbal understanding. Pt aware to continue on current Tx plan. Pt thanked me for the good news today.

## 2016-08-16 ENCOUNTER — Encounter: Payer: Self-pay | Admitting: Family Medicine

## 2016-08-16 ENCOUNTER — Ambulatory Visit (INDEPENDENT_AMBULATORY_CARE_PROVIDER_SITE_OTHER): Payer: BLUE CROSS/BLUE SHIELD | Admitting: Family Medicine

## 2016-08-16 VITALS — BP 142/86 | HR 64 | Temp 98.9°F | Ht 71.0 in | Wt 231.0 lb

## 2016-08-16 DIAGNOSIS — E782 Mixed hyperlipidemia: Secondary | ICD-10-CM | POA: Diagnosis not present

## 2016-08-16 DIAGNOSIS — E785 Hyperlipidemia, unspecified: Secondary | ICD-10-CM | POA: Insufficient documentation

## 2016-08-16 DIAGNOSIS — R002 Palpitations: Secondary | ICD-10-CM

## 2016-08-16 DIAGNOSIS — J45909 Unspecified asthma, uncomplicated: Secondary | ICD-10-CM | POA: Insufficient documentation

## 2016-08-16 DIAGNOSIS — J4520 Mild intermittent asthma, uncomplicated: Secondary | ICD-10-CM | POA: Diagnosis not present

## 2016-08-16 DIAGNOSIS — R030 Elevated blood-pressure reading, without diagnosis of hypertension: Secondary | ICD-10-CM | POA: Diagnosis not present

## 2016-08-16 NOTE — Patient Instructions (Signed)
WE NOW OFFER   [facility] FAST TRACK!!!  SAME DAY Appointments for ACUTE CARE  Such as: Sprains, Injuries, cuts, abrasions, rashes, muscle pain, joint pain, back pain Colds, flu, sore throats, headache, allergies, cough, fever  Ear pain, sinus and eye infections Abdominal pain, nausea, vomiting, diarrhea, upset stomach Animal/insect bites  3 Easy Ways to Schedule: Walk-In Scheduling Call in scheduling Mychart Sign-up: [email or web address]         

## 2016-08-16 NOTE — Progress Notes (Signed)
   Subjective:    Patient ID: Bruce Ryan, male    DOB: [birth year], 36 y.o.   MRN: [id number]  HPI 36 yr old male to establish with us and to discuss his ongoing workup for palpitations. He was seen in the ER on 07-14-16 for palpitations and his evaluation then was normal. No etiology was found then. He was referred to Cardiology and he saw Tereso NewcomerScott Weaver on 07-25-16 for this. He had a stress ECHO on 08-09-16 which was normal and he is currently wearing a 30 day event monitor. He feels fine today and he has not had any palpitations for the past week. No SOB and no chest pain. He weighed 280 lbs 6 months ago and he set up a strict regimen of diet and exercise. He also quit using all alcohol and all tobacco products 6 months ago.    Review of Systems  Constitutional: Negative.   HENT: Positive for congestion, postnasal drip and sinus pain.   Respiratory: Negative.   Cardiovascular: Negative.   Gastrointestinal: Negative.        Objective:   Physical Exam  Constitutional: He appears well-developed and well-nourished. No distress.  HENT:  Right Ear: External ear normal.  Left Ear: External ear normal.  Nose: Nose normal.  Mouth/Throat: Oropharynx is clear and moist.  Eyes: Conjunctivae and EOM are normal. Pupils are equal, round, and reactive to light.  Neck: Neck supple. No thyromegaly present.  Cardiovascular: Normal rate, regular rhythm, normal heart sounds and intact distal pulses.   Pulmonary/Chest: Effort normal and breath sounds normal. No respiratory distress. He has no wheezes. He has no rales.  Lymphadenopathy:    He has no cervical adenopathy.          Assessment & Plan:  Introductory visit for this patient who is undergoing a workup for palpitations. We will follow along with Cardiology. Gershon CraneStephen Leotis Isham, MD

## 2016-10-25 ENCOUNTER — Telehealth: Payer: Self-pay | Admitting: Physician Assistant

## 2016-10-25 NOTE — Telephone Encounter (Signed)
I have personally reviewed the ECGs from his stress test and the strips from his monitor. The stress test does not show any ECG changes or abnormal rhythms and rules out ischemic heart disease.  The monitor mainly shows normal rhythm.  There are rare PVCs.  He recorded symptoms without about half of these. He also has a slow heart beat during sleep which is essentially benign.  However, this can be seen in patients with obstructive sleep apnea. I reviewed his note from his visit with me and he did report a hx of snoring. PLAN:  1. I recommend he make sure he is avoiding all caffeine/stimulants as caffeine can cause PVCs. 2. If he feels that he is avoiding caffeine completely, we can try him on a very low dose of a beta-blocker to take only as needed for symptomatic palpitations - Propranolol 10 mg Once daily as needed (he should not take more than once a day). 3. I recommend he get a sleep test to screen for sleep apnea (split night sleep test).  If he is willing, we can put the order in for him.  4. If he would like to try the Propranolol QD as needed, he will need a follow up with me or Dr. Delane GingerPhil Nahser in 1-2 months. Tereso NewcomerScott Frederika Hukill, PA-C    10/25/2016 12:57 PM

## 2016-10-25 NOTE — Telephone Encounter (Signed)
Ptcb today asking for his monitor results. I stated to pt Dr. Harvie BridgeNahser's nurse Eula FriedMichelle S. RN had lmom on 09/06/16 and then again on with results on 09/14/16 @ 12:06 pm. Pt states he must have missed this. I went over the monitor results with the pt. Pt started becoming very frustrated that his testing that he has had done has come back ok. Pt states that he has gone back to working out and his symptoms have returned. States he feels the palpitations and then feels like he may pass out. I did ask pt if he has been making sure to stay hydrated especially with this heat and then working out on top of that causing the body to sweat more which will cause dehydration. I did offer to the pt if he would like to schedule an appt to come in and s/w the provider APP/Dr. Melburn PopperNasher to discuss further his symptoms. Pt began to raise his voice even louder to me. Pt states to me that his family and his doctors tell him it is anxiety. Pt yells at me that he had 1 panic attack when he was 18 and it is not anxiety doing this to his heart. I did advise pt that anxiety can cause palpitations. I asked pt if he has s/w his PCP as to how he has been feeling, pt answered no. I again asked pt would he like to see Dr. Kennith CenterNahser/APP or did he want to see his PCP first and if PCP recommends to see Cardiology again that we will be happy to see him. Pt yelled at me and said he is going to probably seek a second opinion else where, which at that time pt hung up on me.

## 2016-10-25 NOTE — Telephone Encounter (Signed)
Lmtcb to go over recommendations per Tereso NewcomerScott Weaver, PA.

## 2016-10-25 NOTE — Telephone Encounter (Signed)
New message ° ° ° ° ° °Calling to get monitor results °

## 2017-07-02 ENCOUNTER — Ambulatory Visit: Payer: Self-pay | Admitting: *Deleted

## 2017-07-02 NOTE — Telephone Encounter (Signed)
Patient has had leg swelling in legs for the last couple weeks. Patient states he has varicose veins and sometimes feels like he has cramps. Patient feels warmth in his legs. Patient had discomfort in his legs. Patient has been monitoring his BP- it has been normal. Patient is going to [facility] to have his leg evaluated- he will keep his appointment tomorrow to address his edema.  Reason for Disposition . [1] Thigh, calf, or ankle swelling AND [2] bilateral AND [3] 1 side is more swollen  Answer Assessment - Initial Assessment Questions 1. ONSET: "When did the swelling start?" (e.g., minutes, hours, days)     2-3 weeks 2. LOCATION: "What part of the leg is swollen?"  "Are both legs swollen or just one leg?"     Knee down 3. SEVERITY: "How bad is the swelling?" (e.g., localized; mild, moderate, severe)  - Localized - small area of swelling localized to one leg  - MILD pedal edema - swelling limited to foot and ankle, pitting edema < 1/4 inch (6 mm) deep, rest and elevation eliminate most or all swelling  - MODERATE edema - swelling of lower leg to knee, pitting edema > 1/4 inch (6 mm) deep, rest and elevation only partially reduce swelling  - SEVERE edema - swelling extends above knee, facial or hand swelling present      mild 4. REDNESS: "Does the swelling look red or infected?"     No- patient does reports he sees  Area that looks bruised L leg  5. PAIN: "Is the swelling painful to touch?" If so, ask: "How painful is it?"   (Scale 1-10; mild, moderate or severe)     Discomfort- cramping 6. FEVER: "Do you have a fever?" If so, ask: "What is it, how was it measured, and when did it start?"      no 7. CAUSE: "What do you think is causing the leg swelling?"     On feet a lot- walks a lot- varicose veins 8. MEDICAL HISTORY: "Do you have a history of heart failure, kidney disease, liver failure, or cancer?"     No- patient's family have kidney disaese 9. RECURRENT SYMPTOM: "Have you had leg swelling  before?" If so, ask: "When was the last time?" "What happened that time?"     no 10. OTHER SYMPTOMS: "Do you have any other symptoms?" (e.g., chest pain, difficulty breathing)       no 11. PREGNANCY: "Is there any chance you are pregnant?" "When was your last menstrual period?"       n/a  Protocols used: LEG SWELLING AND EDEMA-A-AH

## 2017-07-02 NOTE — Telephone Encounter (Signed)
Monitor for Urgent care arrival 

## 2017-07-03 ENCOUNTER — Encounter: Payer: Self-pay | Admitting: Family Medicine

## 2017-07-03 ENCOUNTER — Ambulatory Visit: Payer: BLUE CROSS/BLUE SHIELD | Admitting: Family Medicine

## 2017-07-03 VITALS — BP 120/80 | HR 74 | Temp 98.3°F | Ht 71.0 in | Wt 242.8 lb

## 2017-07-03 DIAGNOSIS — I83893 Varicose veins of bilateral lower extremities with other complications: Secondary | ICD-10-CM

## 2017-07-03 NOTE — Progress Notes (Signed)
   Subjective:    Patient ID: Bruce Ryan, male    DOB: Oct 25, 1980, 37 y.o.   MRN: [id number]  HPI Here to follow up on an ER visit yesterday. For several weeks he has had intermittent swelling and mild pain in both lower legs. No SOB. He says he has been doing a lot more walking and standing on his job lately than usual. At the ER his labs were normal, including renal function. He has a urine culture pending because they saw trace leukocytes on the UA, but he has no UTI symptoms. EKG was normal. They did US on both lower legs and found some varicosities but no DVTs. He was told to follow up with us.    Review of Systems  Constitutional: Negative.   Respiratory: Negative.   Cardiovascular: Positive for leg swelling. Negative for chest pain and palpitations.  Genitourinary: Negative.   Neurological: Negative.        Objective:   Physical Exam  Constitutional: He is oriented to person, place, and time. He appears well-developed and well-nourished.  Cardiovascular: Normal rate, regular rhythm, normal heart sounds and intact distal pulses.  Pulmonary/Chest: Effort normal and breath sounds normal. No respiratory distress. He has no wheezes. He has no rales.  Musculoskeletal:  The lower legs have a number of superficial varicosities, no swelling or tenderness  Neurological: He is alert and oriented to person, place, and time.          Assessment & Plan:  Varicose veins in the legs. I suggested he wear compression stockings every day.  Gershon CraneStephen Pablo Stauffer, MD

## 2017-07-03 NOTE — Telephone Encounter (Signed)
Confirmed patient did arrive in the ER yesterday 07/02/2017.

## 2018-07-05 IMAGING — CR DG CHEST 2V
2 series · 2 of 2 positions shown · non-contrast
Comparison: 03/17/2016

CLINICAL DATA: Chest pain

EXAM:
CHEST  2 VIEW

[chest pa]
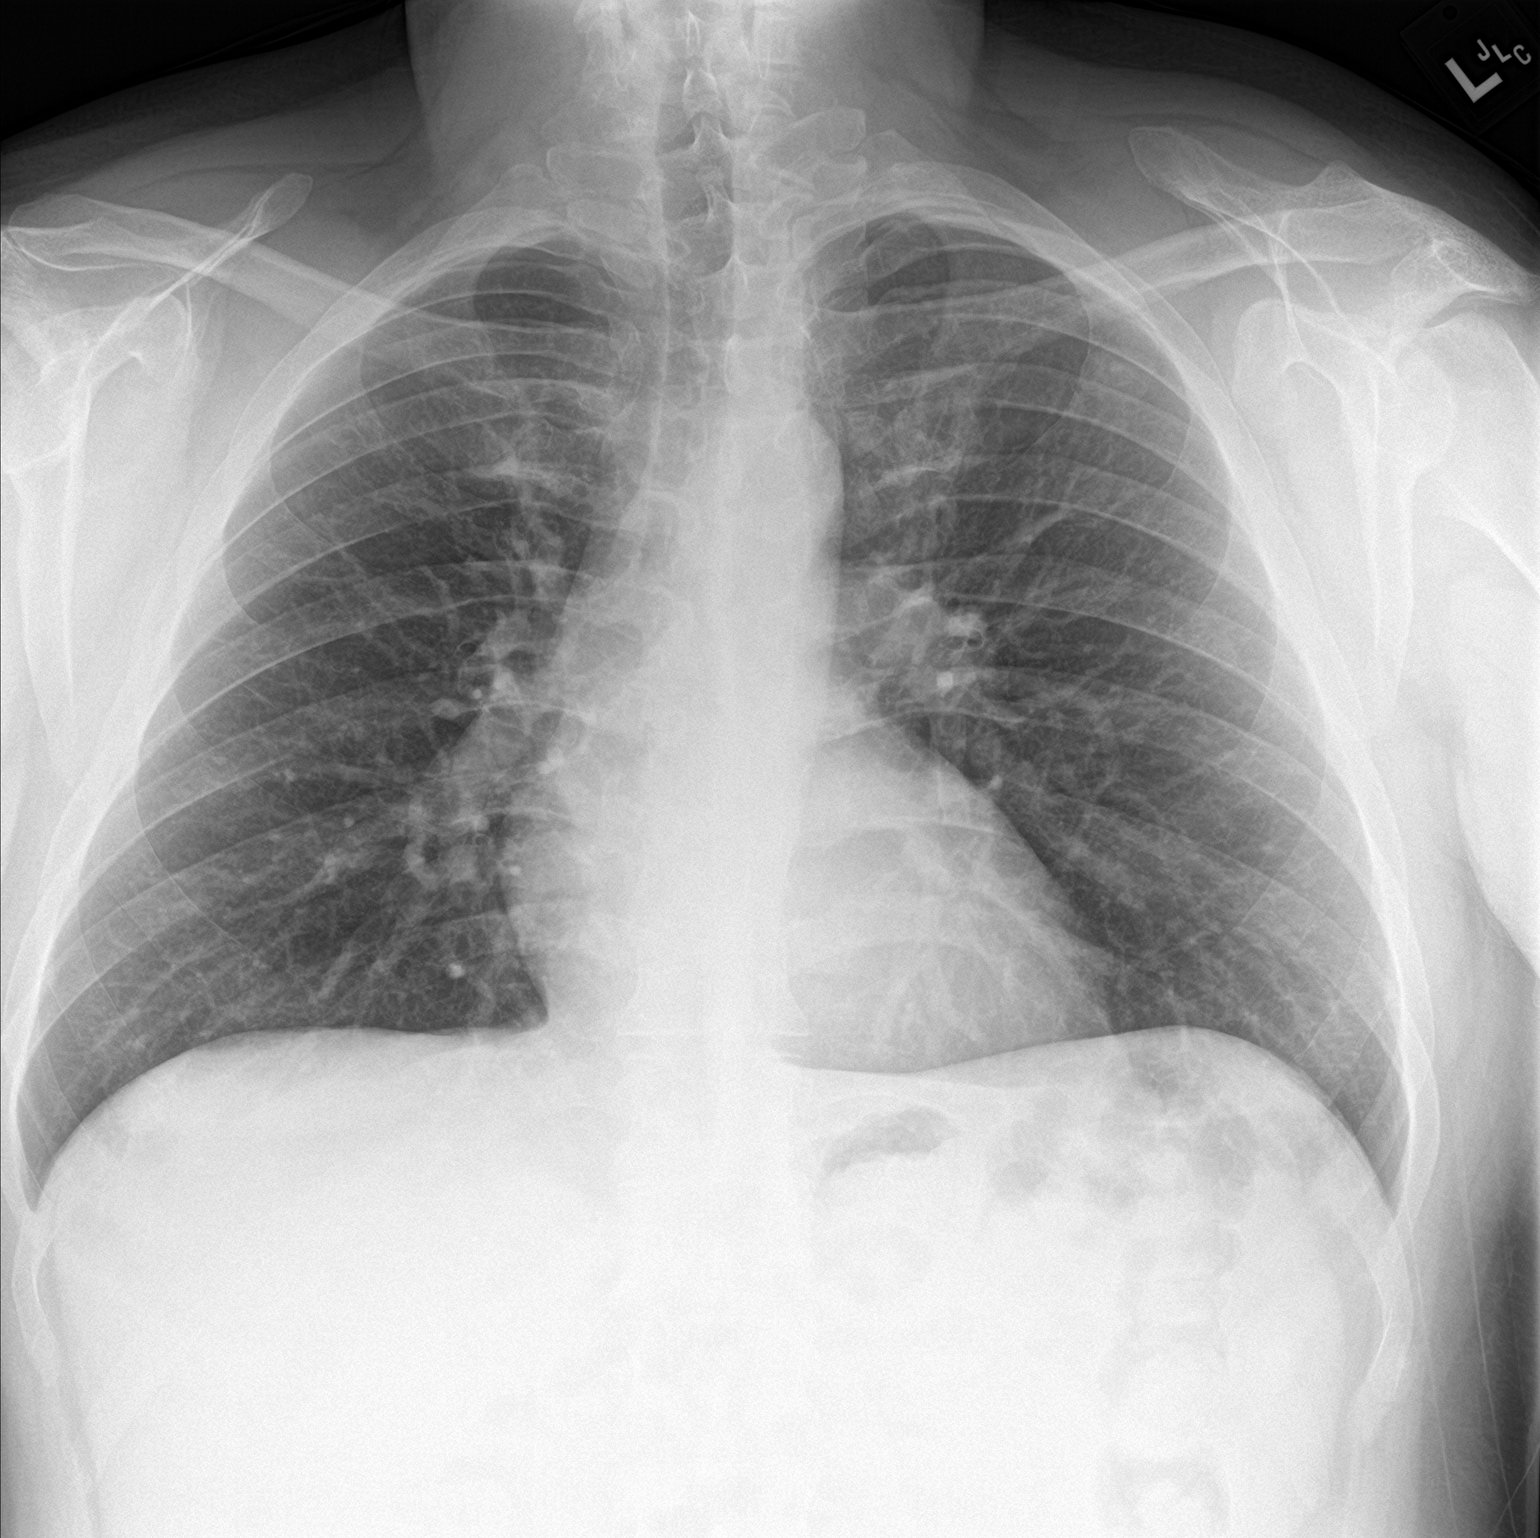

[chest lat]
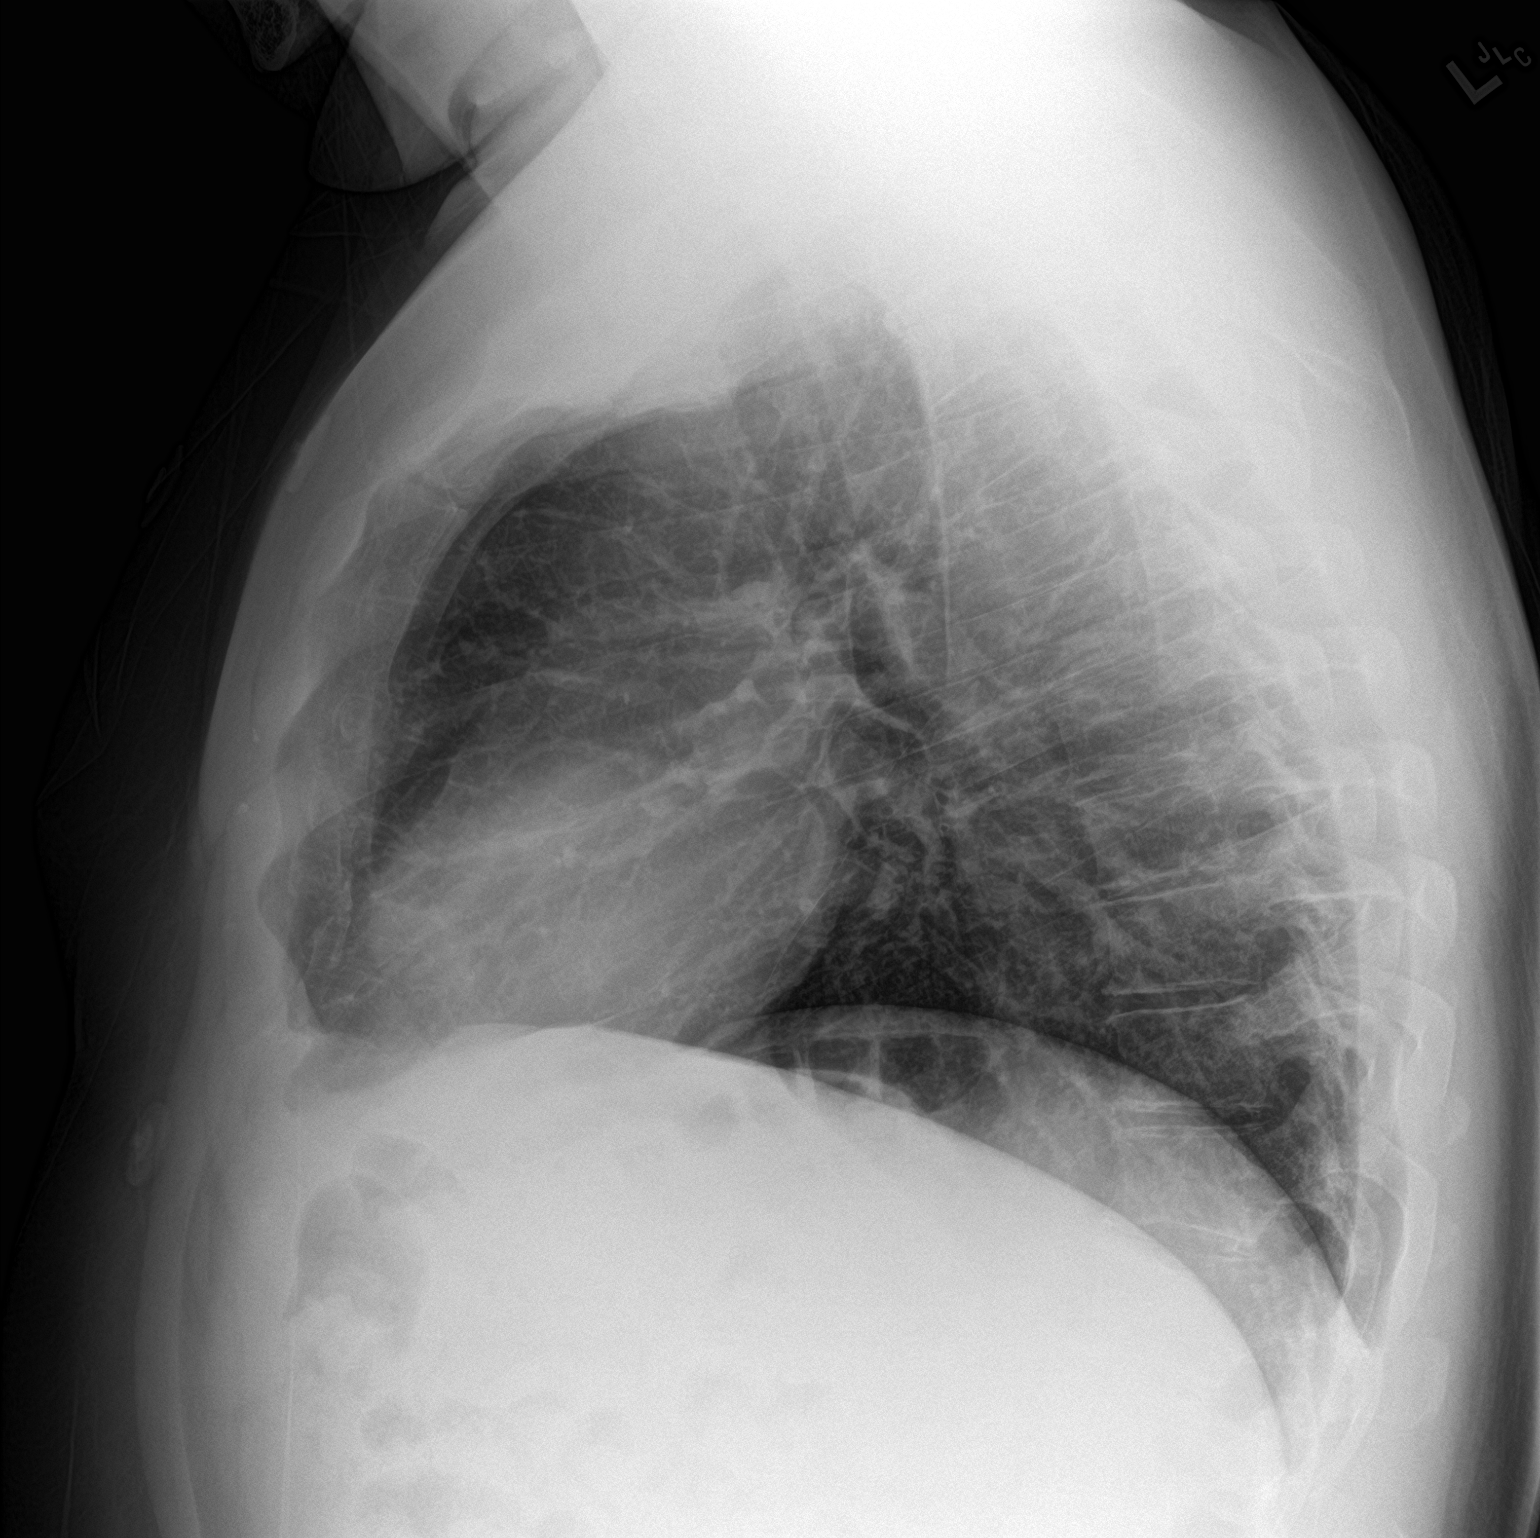

[2 of 2 positions shown; findings below may reference images not displayed]

FINDINGS: Normal heart size and mediastinal contours. No acute infiltrate or
edema. No effusion or pneumothorax. No acute osseous findings.
IMPRESSION: Negative chest.
